# Patient Record
Sex: Female | Born: 1995 | Race: White | Hispanic: No | Marital: Single | Smoking: Never smoker
Health system: Southern US, Community
[De-identification: ages and names within clinical notes are randomized; demographics above are authoritative.]

## PROBLEM LIST (undated history)

## (undated) DIAGNOSIS — F418 Other specified anxiety disorders: Secondary | ICD-10-CM

## (undated) HISTORY — DX: Other specified anxiety disorders: F41.8

## (undated) HISTORY — PX: WISDOM TOOTH EXTRACTION: SHX21

---
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
## 2016-07-21 ENCOUNTER — Other Ambulatory Visit: Payer: Self-pay | Admitting: Family

## 2016-08-03 ENCOUNTER — Telehealth: Payer: Self-pay | Admitting: Family

## 2016-08-03 ENCOUNTER — Encounter: Payer: Self-pay | Admitting: Family

## 2016-08-03 ENCOUNTER — Ambulatory Visit (INDEPENDENT_AMBULATORY_CARE_PROVIDER_SITE_OTHER): Payer: BLUE CROSS/BLUE SHIELD | Admitting: Family

## 2016-08-03 VITALS — BP 120/82 | HR 72 | Temp 98.4°F | Resp 16 | Ht 64.0 in | Wt 176.6 lb

## 2016-08-03 DIAGNOSIS — E785 Hyperlipidemia, unspecified: Secondary | ICD-10-CM | POA: Insufficient documentation

## 2016-08-03 DIAGNOSIS — F329 Major depressive disorder, single episode, unspecified: Secondary | ICD-10-CM | POA: Diagnosis not present

## 2016-08-03 DIAGNOSIS — Z23 Encounter for immunization: Secondary | ICD-10-CM | POA: Diagnosis not present

## 2016-08-03 DIAGNOSIS — Z0000 Encounter for general adult medical examination without abnormal findings: Secondary | ICD-10-CM

## 2016-08-03 DIAGNOSIS — F32A Depression, unspecified: Secondary | ICD-10-CM

## 2016-08-03 LAB — URINALYSIS, ROUTINE W REFLEX MICROSCOPIC
Bilirubin Urine: NEGATIVE
HGB URINE DIPSTICK: NEGATIVE
Ketones, ur: NEGATIVE
Leukocytes, UA: NEGATIVE
NITRITE: NEGATIVE
PH: 6 (ref 5.0–8.0)
RBC / HPF: NONE SEEN (ref 0–?)
SPECIFIC GRAVITY, URINE: 1.025 (ref 1.000–1.030)
TOTAL PROTEIN, URINE-UPE24: NEGATIVE
URINE GLUCOSE: NEGATIVE
Urobilinogen, UA: 0.2 (ref 0.0–1.0)
WBC, UA: NONE SEEN (ref 0–?)

## 2016-08-03 LAB — HEPATIC FUNCTION PANEL
ALK PHOS: 60 U/L (ref 47–119)
ALT: 11 U/L (ref 0–35)
AST: 14 U/L (ref 0–37)
Albumin: 4.1 g/dL (ref 3.5–5.2)
BILIRUBIN TOTAL: 0.2 mg/dL (ref 0.2–1.2)
Bilirubin, Direct: 0 mg/dL (ref 0.0–0.3)
Total Protein: 7 g/dL (ref 6.0–8.3)

## 2016-08-03 LAB — BASIC METABOLIC PANEL
BUN: 12 mg/dL (ref 6–23)
CALCIUM: 9.5 mg/dL (ref 8.4–10.5)
CO2: 22 mEq/L (ref 19–32)
Chloride: 104 mEq/L (ref 96–112)
Creatinine, Ser: 0.67 mg/dL (ref 0.40–1.20)
GFR: 119.35 mL/min (ref >60.00)
Glucose, Bld: 99 mg/dL (ref 70–99)
POTASSIUM: 3.9 meq/L (ref 3.5–5.1)
SODIUM: 138 meq/L (ref 135–145)

## 2016-08-03 LAB — CBC WITH DIFFERENTIAL/PLATELET
BASOS ABS: 0 10*3/uL (ref 0.0–0.1)
Basophils Relative: 0.3 % (ref 0.0–3.0)
EOS PCT: 1.7 % (ref 0.0–5.0)
Eosinophils Absolute: 0.1 10*3/uL (ref 0.0–0.7)
HCT: 41.3 % (ref 36.0–49.0)
HEMOGLOBIN: 14 g/dL (ref 12.0–16.0)
Lymphocytes Relative: 34.3 % (ref 24.0–48.0)
Lymphs Abs: 2.8 10*3/uL (ref 0.7–4.0)
MCHC: 33.9 g/dL (ref 31.0–37.0)
MCV: 93.9 fl (ref 78.0–98.0)
MONO ABS: 0.6 10*3/uL (ref 0.1–1.0)
MONOS PCT: 7.2 % (ref 3.0–12.0)
Neutro Abs: 4.6 10*3/uL (ref 1.4–7.7)
Neutrophils Relative %: 56.5 % (ref 43.0–71.0)
Platelets: 270 10*3/uL (ref 150.0–575.0)
RBC: 4.4 Mil/uL (ref 3.80–5.70)
RDW: 12.6 % (ref 11.4–15.5)
WBC: 8.2 10*3/uL (ref 4.5–13.5)

## 2016-08-03 LAB — LIPID PANEL
CHOLESTEROL: 246 mg/dL — AB (ref 0–200)
HDL: 77.9 mg/dL (ref >39.00)
LDL CALC: 144 mg/dL — AB (ref 0–99)
NONHDL: 168.16
Total CHOL/HDL Ratio: 3
Triglycerides: 121 mg/dL (ref 0.0–149.0)
VLDL: 24.2 mg/dL (ref 0.0–40.0)

## 2016-08-03 LAB — TSH: TSH: 4.24 u[IU]/mL (ref 0.40–5.00)

## 2016-08-03 NOTE — Patient Instructions (Signed)
Please complete lab work prior to leaving. Work on [employer] diet, regular exercise and weight loss.  Please schedule an appointment with psychiatry [place].  I will try to research some psychiatrists and will send you a message with some contacts.

## 2016-08-03 NOTE — Telephone Encounter (Signed)
See my chart message

## 2016-08-03 NOTE — Addendum Note (Signed)
Addended by: Mervin KungFERGERSON, Creig Landin A on: 08/03/2016 12:10 PM   Modules accepted: Orders

## 2016-08-03 NOTE — Progress Notes (Signed)
Subjective:    Patient ID: Sonya Nelson, female    DOB: 07-Oct-1995, 21 y.o.   MRN: [id number]  HPI  Patient presents today for complete physical.  Immunizations: due for Td.  Diet: fair diet.  Eats more carbs than she should.  May phase back into vegetarian Exercise:  Walks dog regularly. Running is hard on her knee.    Depression/Anxiety- had been following with a psychiatrist at [facility].   Has gained weight.  Not taking ocp regularly. Not helping acne. Attributes her weight gain to the OCP.Not sexually active and has never been sexually active in the past.  Wt Readings from Last 3 Encounters:  08/03/16 176 lb 9.6 oz (80.1 kg) (94 %, Z= 1.52)*  07/25/15 149 lb 6 oz (67.8 kg) (82 %, Z= 0.90)*  10/26/14 150 lb (68 kg) (84 %, Z= 0.99)*   * Growth percentiles are based on CDC 2-20 Years data.   She is seeing dermatology for her acne  Review of Systems  Constitutional: Negative for unexpected weight change.  HENT: Negative for hearing loss and rhinorrhea.   Eyes: Negative for visual disturbance.  Respiratory: Negative for cough.   Cardiovascular: Negative for leg swelling.  Gastrointestinal: Negative for constipation and diarrhea.  Genitourinary: Negative for dysuria, frequency and menstrual problem.  Musculoskeletal:       Has some right knee pain for several years. (has a torn meniscus)  Skin:       + acne  Neurological:       Has 1-2 HA/month, uses excedrin with some improvement  Hematological: Negative for adenopathy.  Psychiatric/Behavioral:       See HPI   Past Medical History:  Diagnosis Date  . Depression with anxiety 07/25/2015     Social History   Social History  . Marital status: Single    Spouse name: N/A  . Number of children: N/A  . Years of education: N/A   Occupational History  . Not on file.   Social History Main Topics  . Smoking status: Never Smoker  . Smokeless tobacco: Never Used  . Alcohol use 0.0 oz/week     Comment: 2 drinks per  month  . Drug use: No  . Sexual activity: No   Other Topics Concern  . 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 WISDOM TOOTH EXTRACTION      Family History  Problem Relation Age of Onset  . Cancer Mother        breast  . Hyperlipidemia Mother   . Osteoarthritis Father   . Hypothyroidism Brother   . Heart disease Maternal Grandmother   . Heart disease Paternal Grandmother   . Hypertension Paternal Grandmother   . Cancer Paternal Grandfather        kidney    Allergies  Allergen Reactions  . Ofloxacin Rash    Current Outpatient Prescriptions on File Prior to Visit  Medication Sig Dispense Refill  . TRI-LO-MARZIA 0.18/0.215/0.25 MG-25 MCG tab TAKE 1 TABLET DAILY 84 tablet 1   No current facility-administered medications on file prior to visit.     BP 120/82 (BP Location: Left Arm, Cuff Size: Normal)   Pulse 72   Temp 98.4 F (36.9 C) (Oral)   Resp 16  Ht 5\' 4"  (1.626 m)   Wt 176 lb 9.6 oz (80.1 kg)   LMP 07/10/2016   SpO2 100%   BMI 30.31 kg/m        Objective:   Physical Exam Physical Exam  Constitutional: She is oriented to person, place, and time. She appears well-developed and well-nourished. No distress.  HENT:  Head: Normocephalic and atraumatic.  Right Ear: Tympanic membrane and ear canal normal.  Left Ear: Tympanic membrane and ear canal normal.  Mouth/Throat: Oropharynx is clear and moist.  Eyes: Pupils are equal, round, and reactive to light. No scleral icterus.  Neck: Normal range of motion. No thyromegaly present.  Cardiovascular: Normal rate and regular rhythm.   No murmur heard. Pulmonary/Chest: Effort normal and breath sounds normal. No respiratory distress. He has no wheezes. She has no rales. She exhibits no tenderness.  Abdominal: Soft.  Bowel sounds are normal. She exhibits no distension and no mass. There is no tenderness. There is no rebound and no guarding.  Musculoskeletal: She exhibits no edema.  Lymphadenopathy:    She has no cervical adenopathy.  Neurological: She is alert and oriented to person, place, and time. She has normal patellar reflexes. She exhibits normal muscle tone. Coordination normal.  Skin: Skin is warm and dry.  Psychiatric: She has a normal mood and affect. Her behavior is normal. Judgment and thought content normal.  Breast/pelvic: deferred         Assessment & Plan:          Assessment & Plan:  Preventative Care- discussed healthy diet, exercise and weight loss.  Obtain routine lab work. Advised pt OK to d/c OCP since it is not helping her acne and she is not sexually active.  If she desires to restart in the future, she can let me know.  Td today. Obtain routine lab work.  Depression- scored 9 on PHQ-9.  Declines medications at this time. She is willing to establish with [place] new psychiatrist in [place]. I have advised pt to schedule appointment.   10 minutes spent counseling patient on her depression today.

## 2017-08-09 ENCOUNTER — Encounter: Payer: BLUE CROSS/BLUE SHIELD | Admitting: Family

## 2017-09-01 ENCOUNTER — Encounter: Payer: BLUE CROSS/BLUE SHIELD | Admitting: Family
